# Patient Record
Sex: Male | Born: 1960 | Race: White | Hispanic: No | Marital: Married
Health system: Southern US, Community
[De-identification: ages and names within clinical notes are randomized; demographics above are authoritative.]

---

## 2004-06-21 ENCOUNTER — Ambulatory Visit (HOSPITAL_COMMUNITY): Admission: RE | Admit: 2004-06-21 | Discharge: 2004-06-21 | Payer: Self-pay

## 2004-08-16 ENCOUNTER — Ambulatory Visit (HOSPITAL_COMMUNITY): Admission: RE | Admit: 2004-08-16 | Discharge: 2004-08-16 | Payer: Self-pay | Admitting: Neurology

## 2004-08-18 ENCOUNTER — Emergency Department (HOSPITAL_COMMUNITY): Admission: EM | Admit: 2004-08-18 | Discharge: 2004-08-18 | Payer: Self-pay | Admitting: Emergency Medicine

## 2004-08-28 ENCOUNTER — Inpatient Hospital Stay (HOSPITAL_COMMUNITY): Admission: EM | Admit: 2004-08-28 | Discharge: 2004-08-30 | Payer: Self-pay | Admitting: Emergency Medicine

## 2004-08-30 ENCOUNTER — Ambulatory Visit: Payer: Self-pay | Admitting: *Deleted

## 2006-01-25 IMAGING — CT CT HEAD W/O CM
1 of 3 series · 16 of 30 positions shown, 20 images · non-contrast
Comparison: 06/21/04.

CLINICAL DATA: Two day history of dizziness.  
 HEAD CT WITHOUT CONTRAST ? 08/28/04:
TECHNIQUE: 5 mm collimated images were obtained from the base of the skull through the vertex according to standard protocol without contrast.

[Series 2189: — · axial · 0.42mm/px · z∈[-577,-457]mm · 16 of 28 slices shown, 20 images]
[im 2/28  brain]
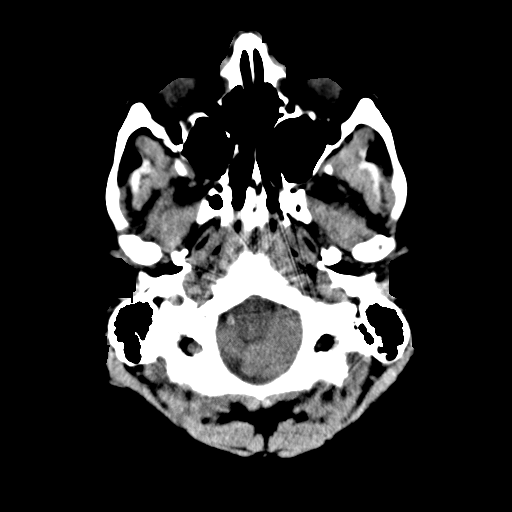
[im 2/28  bone]
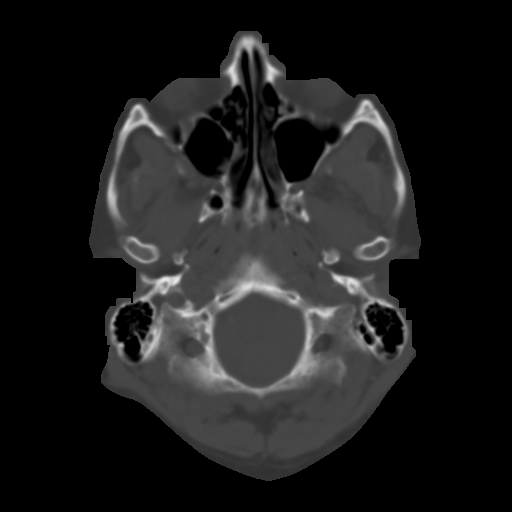
[im 4/28  brain]
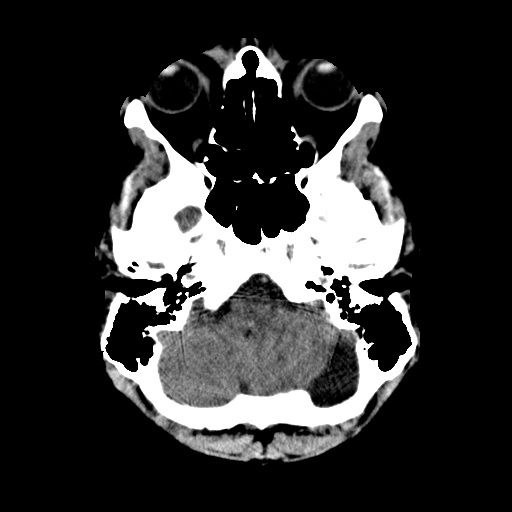
[im 5/28  brain]
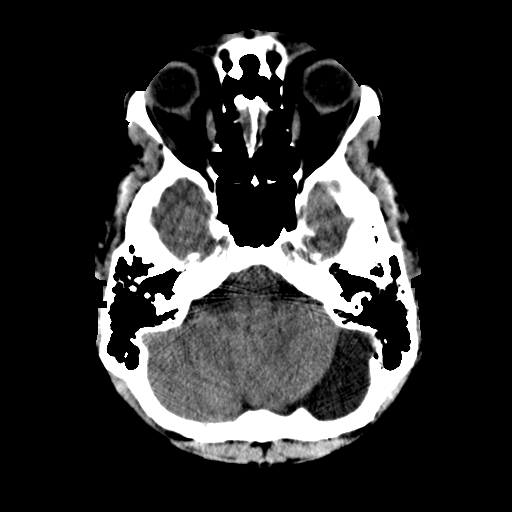
[im 7/28  brain]
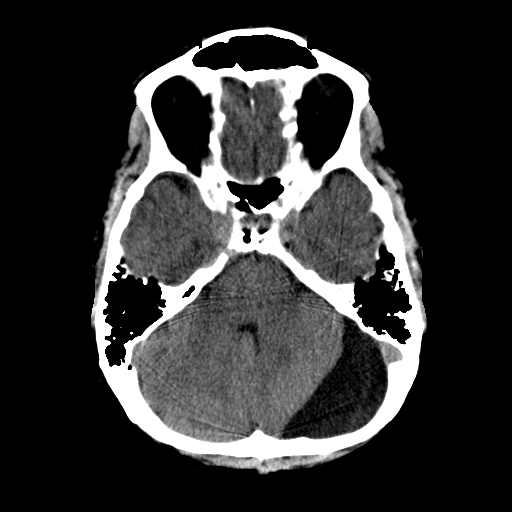
[im 8/28  brain]
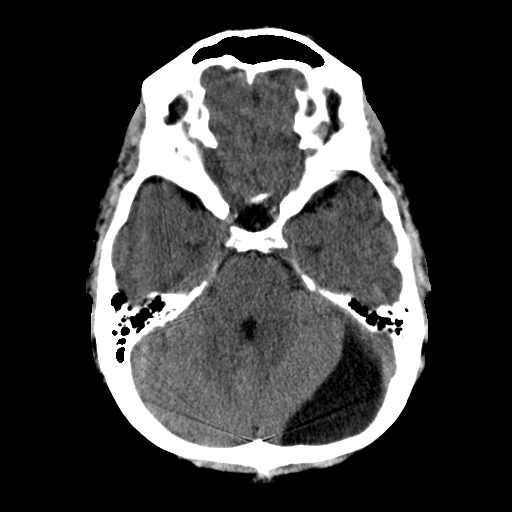
[im 8/28  bone]
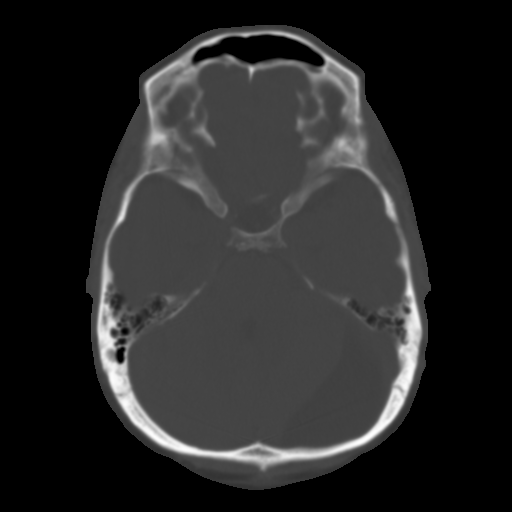
[im 10/28  brain]
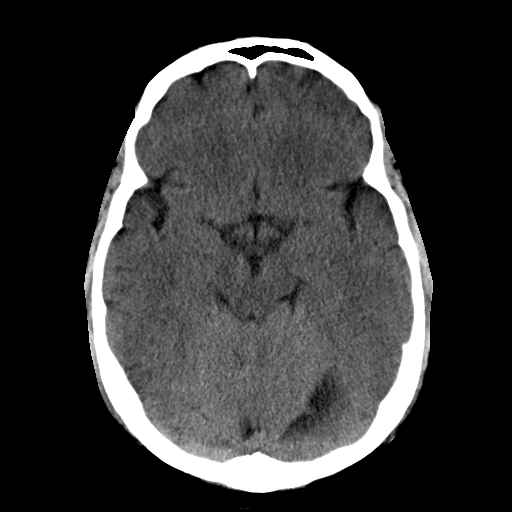
[im 11/28  brain]
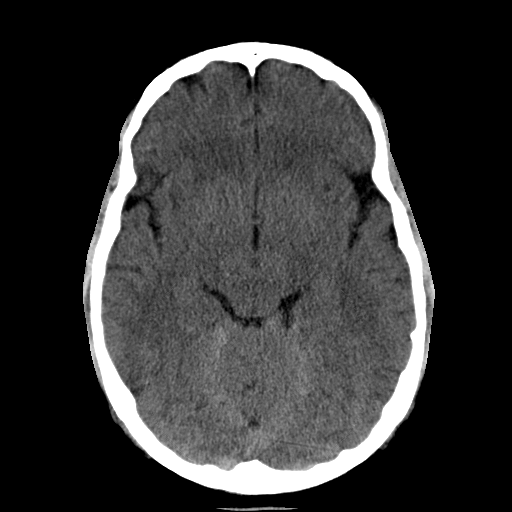
[im 13/28  brain]
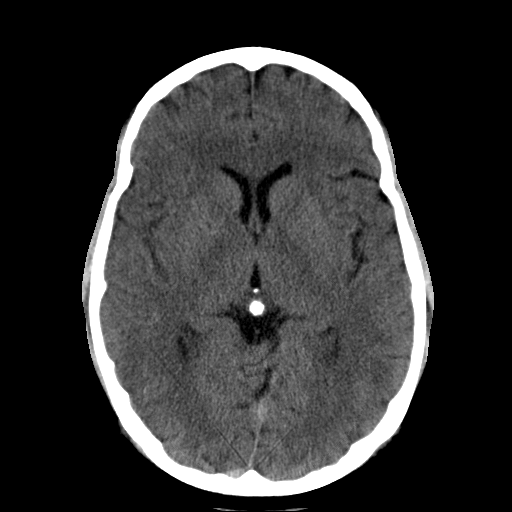
[im 15/28  brain]
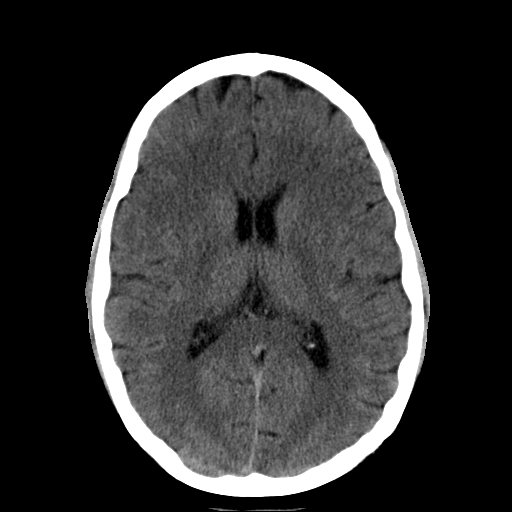
[im 15/28  bone]
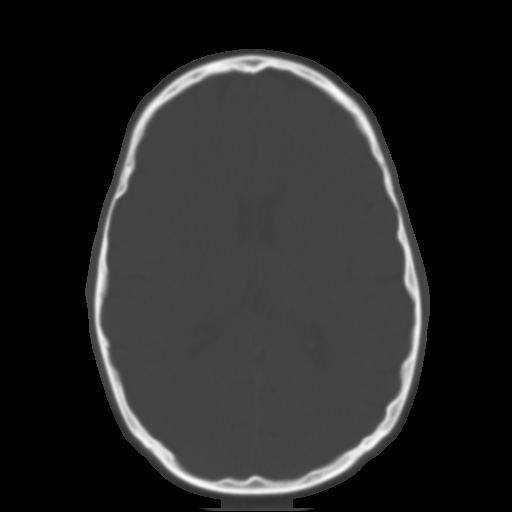
[im 17/28  brain]
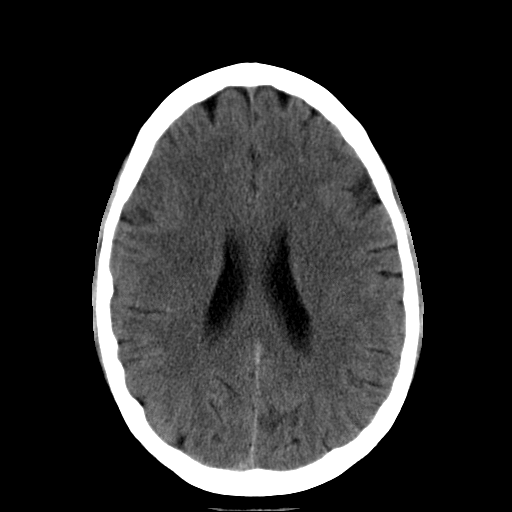
[im 18/28  brain]
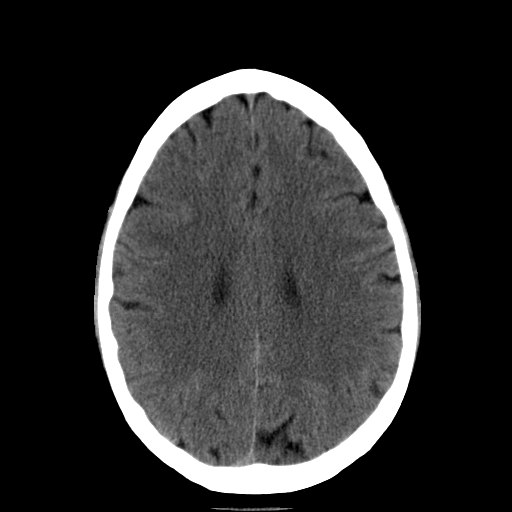
[im 20/28  brain]
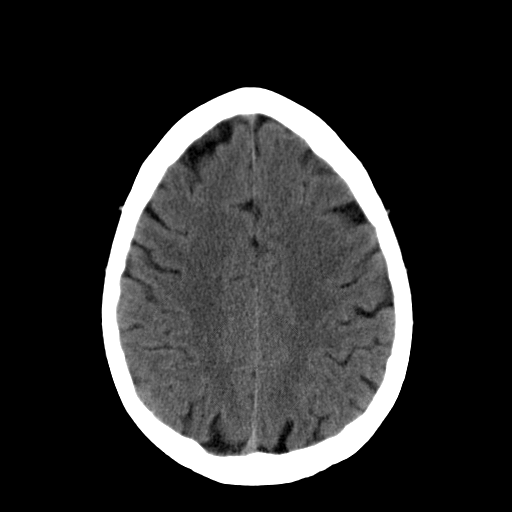
[im 21/28  brain]
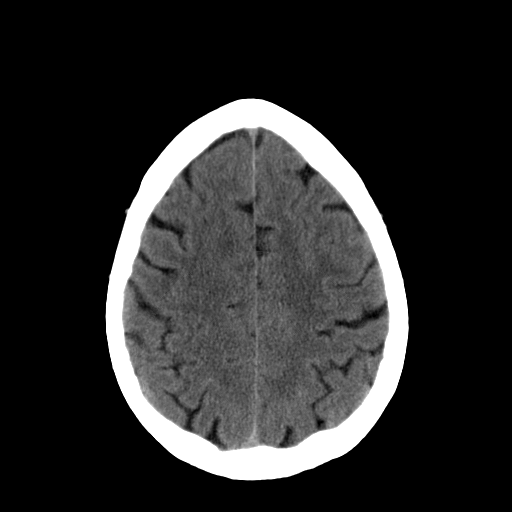
[im 21/28  bone]
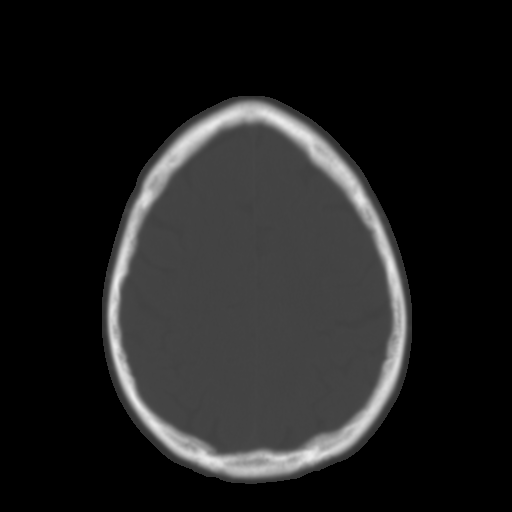
[im 23/28  brain]
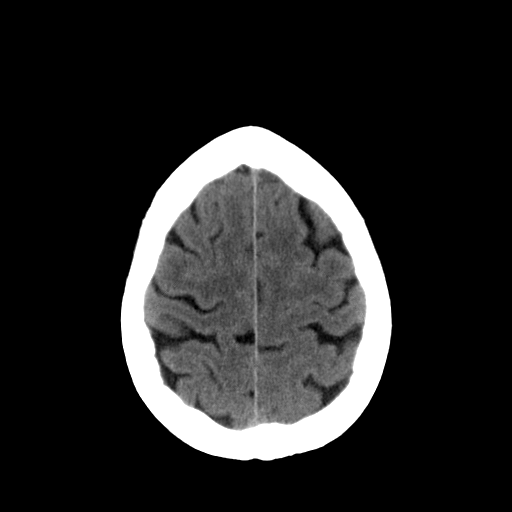
[im 24/28  brain]
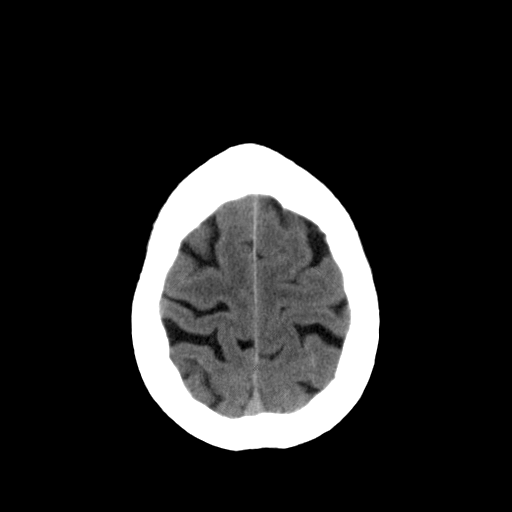
[im 26/28  brain]
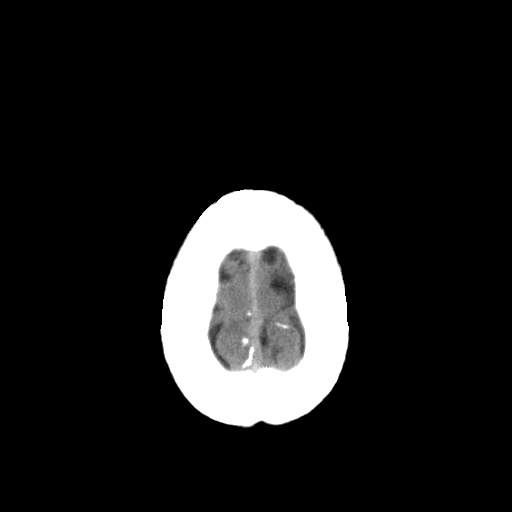

[16 of 30 positions shown; findings below may reference images not displayed]

FINDINGS: There is no acute hemorrhage or infarction or other significant abnormality.  Again noted is an arachnoid cyst in the left side of the posterior fossa.  There is a slight mass effect upon the fourth ventricle but this is unchanged since the prior examination.  This finding has been previously evaluated on MRI scan.
 The visualized paranasal sinuses and mastoid air cells are normal.  The internal auditory canals are bilaterally symmetrical and normal.
IMPRESSION: No acute abnormality.
 Stable posterior fossa arachnoid cyst.

## 2006-01-27 IMAGING — US US CAROTID DUPLEX BILAT
1 series · 14 of 24 positions shown · non-contrast
Comparison: none

HISTORY: Dizziness, presyncope, hypertension, question stroke

[Series 1: unknown · 0.09mm/px · 14 of 58 slices shown]
[im 1/58]
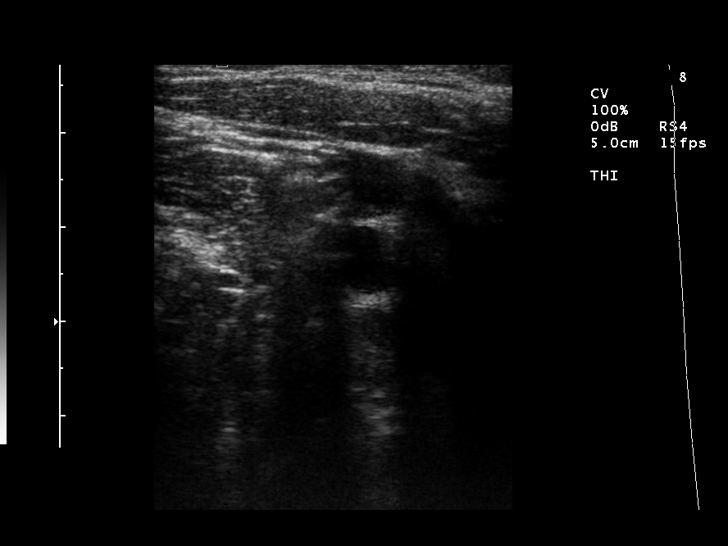
[im 5/58]
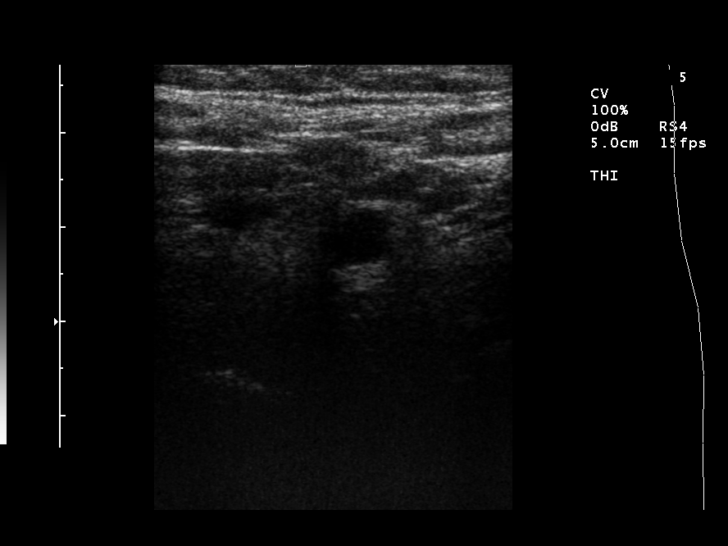
[im 10/58]
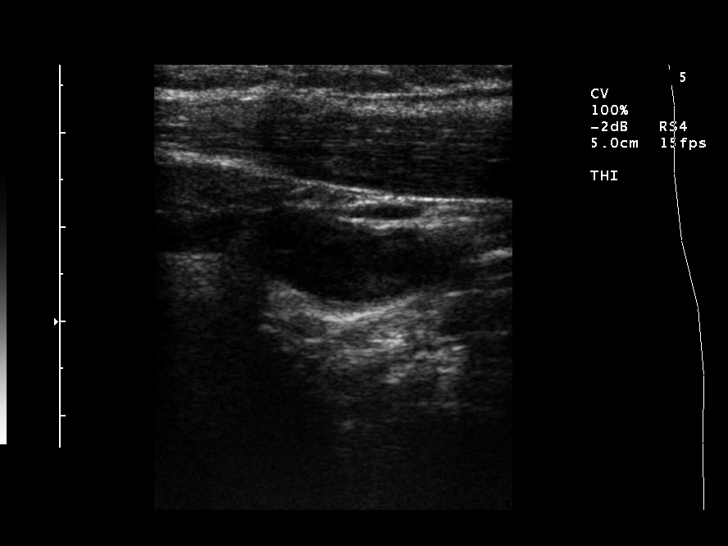
[im 15/58]
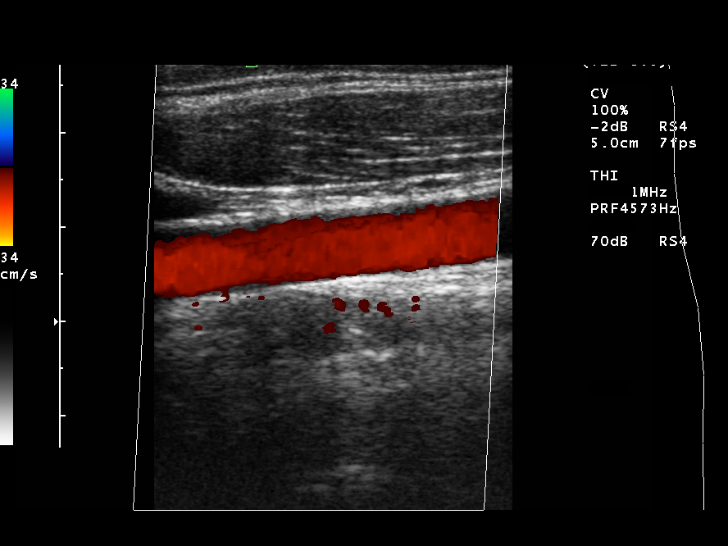
[im 18/58]
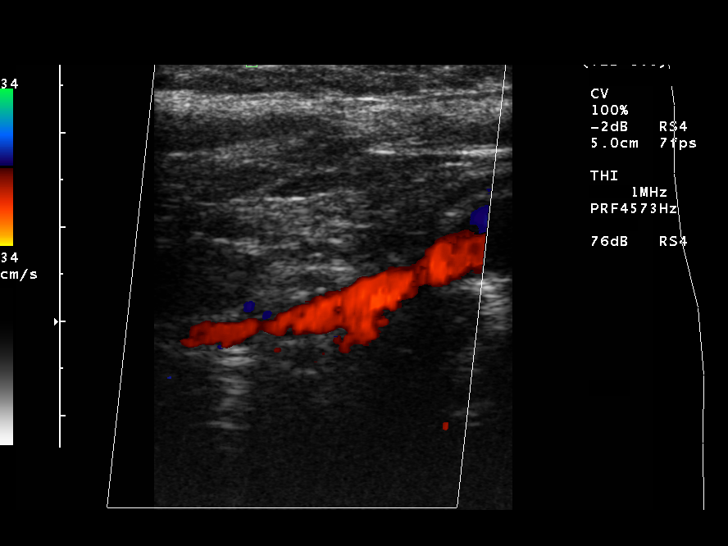
[im 23/58]
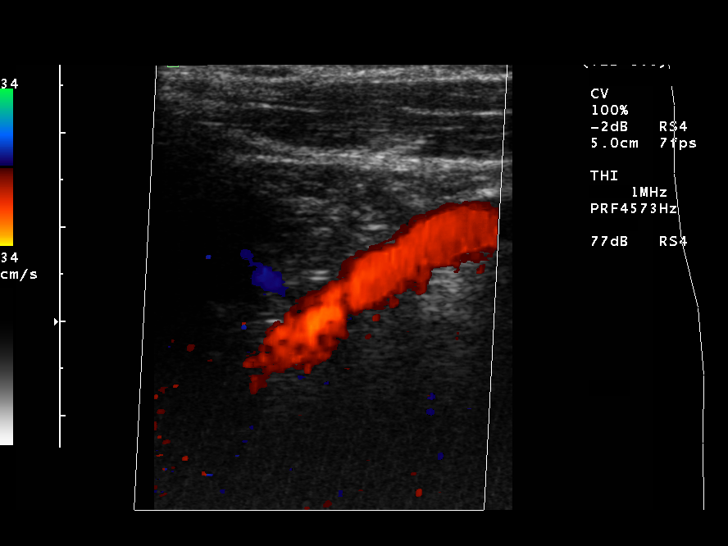
[im 28/58]
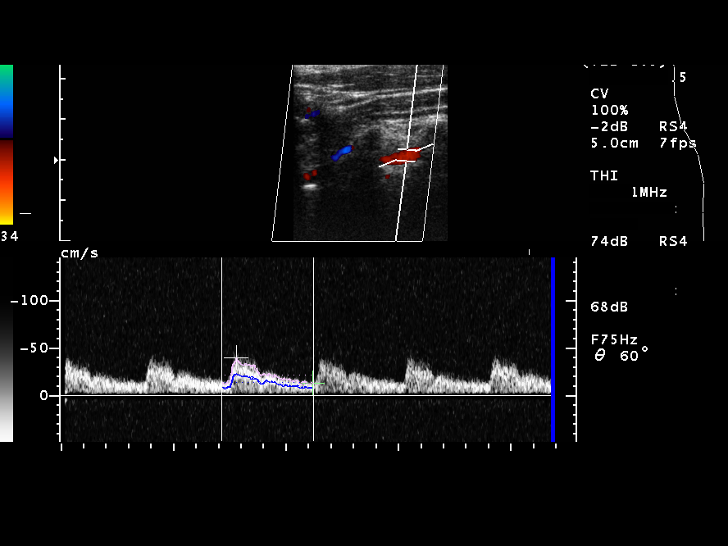
[im 30/58]
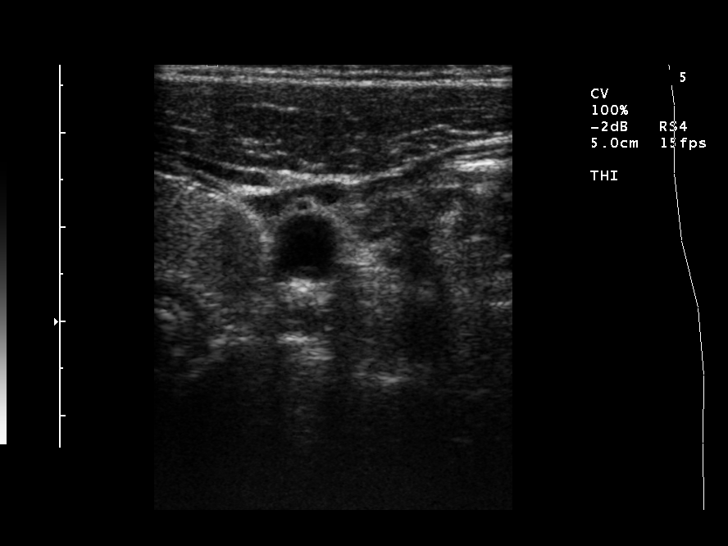
[im 35/58]
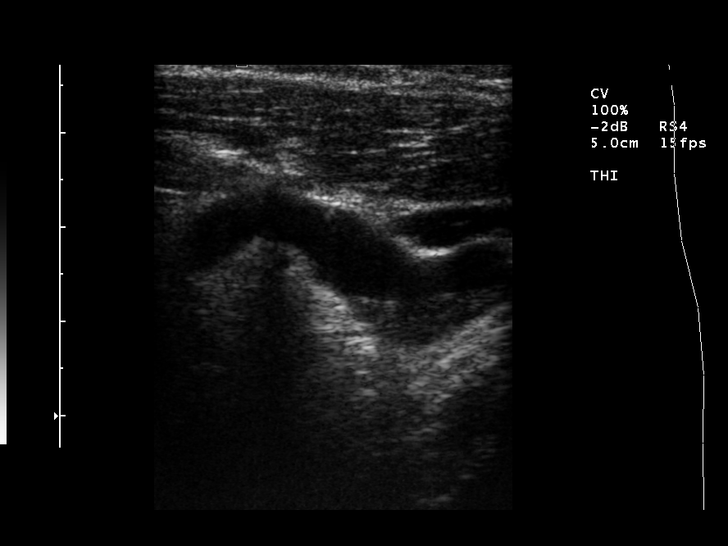
[im 40/58]
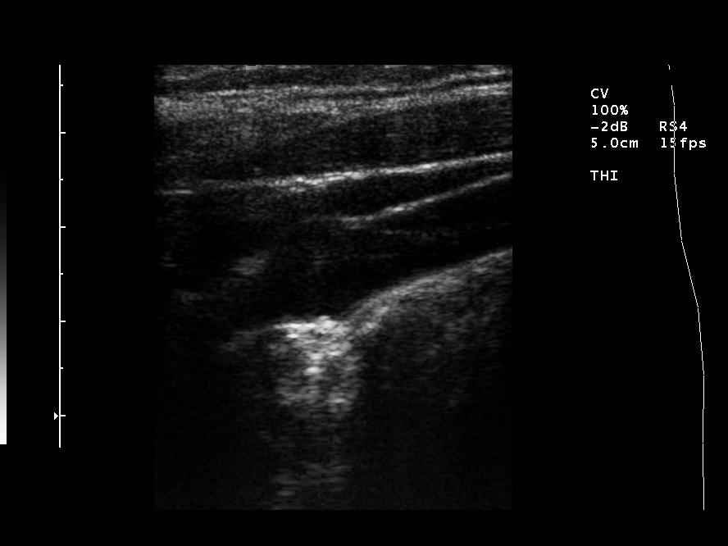
[im 45/58]
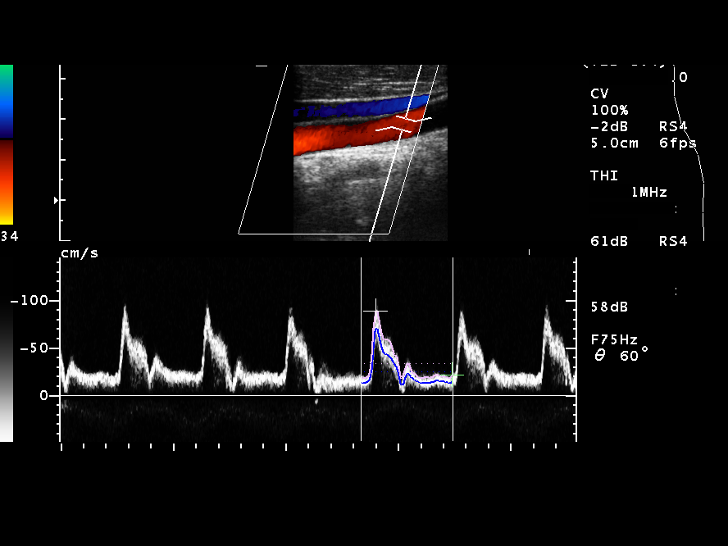
[im 48/58]
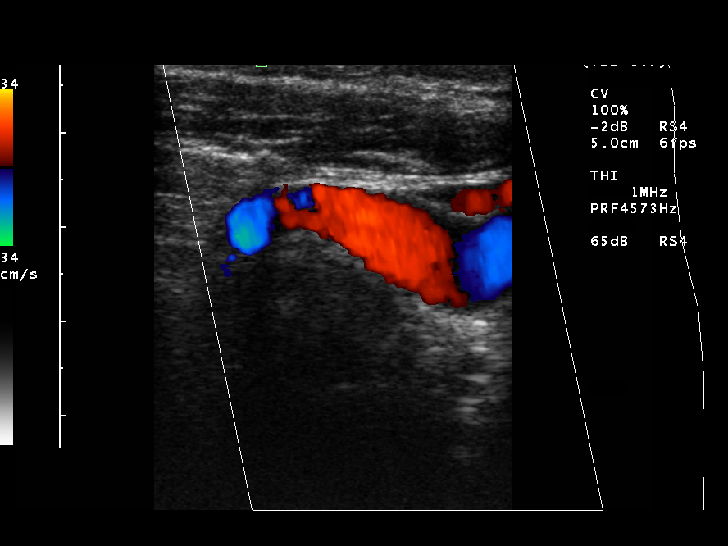
[im 53/58]
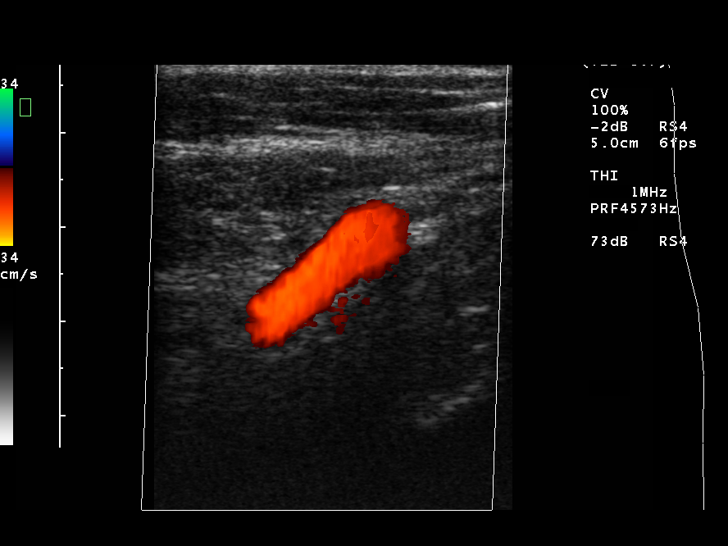
[im 58/58]
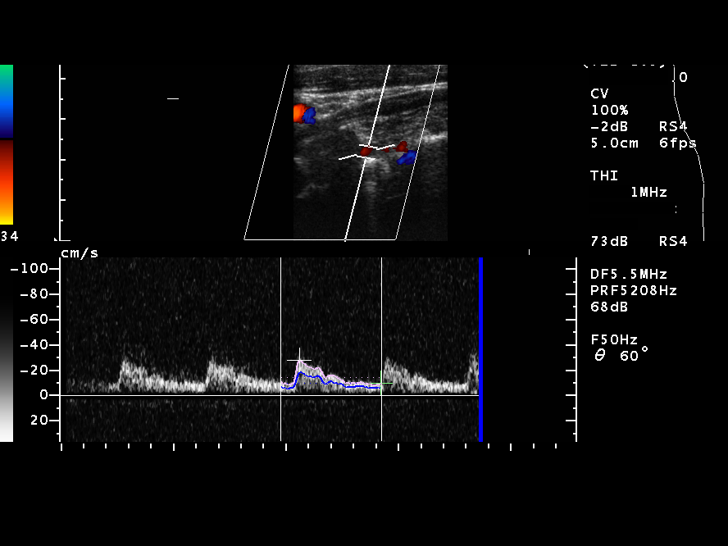

[14 of 24 positions shown; findings below may reference images not displayed]

ULTRASOUND CAROTID DUPLEX BILATERAL:

No significant plaque formation on grayscale imaging.
Mildly tortuous carotid systems.
No high velocity jets seen on color Doppler imaging.
Minimal spectral broadening on wave form analysis, likely related to tortuosity.
Following peak systolic velocities obtained, in cm per second:

Right CCA 77
Right ICA 81
Right ECA 93
Right ICA/CCA ratio

Left CCA 89
Left ICA 75
Left ECA 94
Left ICA/CCA ratio

Antegrade flow present bilateral vertebral arteries.
IMPRESSION: Minimally tortuous carotid systems bilaterally.
No evidence of plaque formation or stenosis.

## 2007-06-06 ENCOUNTER — Ambulatory Visit: Payer: Self-pay | Admitting: Internal Medicine

## 2007-06-06 ENCOUNTER — Ambulatory Visit (HOSPITAL_COMMUNITY): Admission: RE | Admit: 2007-06-06 | Discharge: 2007-06-06 | Payer: Self-pay | Admitting: Internal Medicine

## 2007-08-01 ENCOUNTER — Ambulatory Visit: Payer: Self-pay | Admitting: Internal Medicine

## 2010-07-27 NOTE — Op Note (Signed)
NAMELACOREY, BRUSCA                  ACCOUNT NO.:  [id number]   MEDICAL RECORD NO.:  [id number]          PATIENT TYPE:  AMB   LOCATION:  DAY                           FACILITY:  [facility]   PHYSICIAN:  R. Roetta Sessions, M.D. DATE OF BIRTH:  [birth year]   DATE OF PROCEDURE:  06/06/2007  DATE OF DISCHARGE:                               OPERATIVE REPORT   PROCEDURE:  Esophagogastroduodenoscopy with pyloric channel dilation.   INDICATIONS FOR PROCEDURE:  A 50 year old gentleman with refractory  reflux symptoms he describes as  heartburn and regurgitation, suboptimal  response to Prevacid previously.  He has briefly been on Zegerid over  the past 3 weeks prescribed Dr. Juanetta Gosling, with some improvement in his  symptoms.  His nocturnal symptoms are particularly bothersome.  He does  not have any odynophagia or dysphagia.  EGD is now being done.  Potential risks, benefits and alternatives, limitations have been  reviewed and questions answered.  He is agreeable.  Please see the  documentation in the medical record.   PROCEDURE NOTE:  O2 saturation, blood pressure, pulse and respirations  were monitored throughout the entire procedure.  Conscious sedation:  Versed 7 mg IV, Demerol 150 mg IV in divided doses, Tessalon Perles for  oropharyngeal anesthesia.  Instrument:  Pentax video chip system.   FINDINGS:  Examination of the tubular esophagus revealed normal mucosa.  EG junction patent and easily traversed.   Stomach:  The gastric cavity was empty and appeared somewhat cavernous.  It insufflated very well with air.  A thorough examination of the  gastric mucosa including retroflexed view of the proximal stomach and  esophagogastric junction demonstrated only a small hiatal hernia and an  abnormal pyloric channel.  The pyloric channel appeared fibrotic and  stenotic.  It appeared to be scarred down.  In fact, the aperture  through the pyloric channel was critically small and I was unable to  admit  the scope, even with moderate pressure applied.  Please see  multiple photos.  There was no ulcer or infiltrating process.  I railed  a TTS dilating balloon (10-12 mm) through the scope and placed it across  the stenotic lumen and sequentially dilated up to 12 mm maximally for  one minute, took the balloon down and again attempted to get through the  pyloric channel.  Although the balloon did produce some dilation, the  aperture remained critically small and it would not admit the scope.  Subsequently I railed a 12-15-mm graduated balloon through the scope and  across pylorus channel and sequentially dilated up to a maximal 15 mm,  8.5 atmospheres, for 1 minute.  I took the balloon down.  There was  minimal bleeding with this maneuver.  However, this was effective in  opening up the lumen to a point were I could easily pass the scope.  There was no apparent complication related to this maneuver.  Aside from  the stomach, pyloric channel, the bulb and second portion appeared  entirely normal.  The patient tolerated the somewhat prolonged EGD very  well, was reacted in endoscopy.  IMPRESSION:  1. Normal esophagus.  2. Small hiatal hernia.  3. A somewhat cavernous but otherwise normal-appearing stomach.  4. Abnormal stenotic, fibrotic, scarred pyloric channel producing      significant gastric outlet obstruction, status post sequential      balloon dilation as described above.  5. Otherwise D1 and D2 appeared normal.   I suspect the patient is experiencing reflux and nocturnal  regurgitation, in large part secondary to delayed gastric emptying.  Today's findings could be secondary to nonsteroidal use over the years.  Helicobacter pylori needs to be ruled out.   I doubt this is a congenital lesion.   RECOMMENDATIONS:  1. Continue Zegerid 40 mg orally daily.  He needs to go by my office      for some free samples.  2. We are going to check H. pylori serologies.  3. Should avoid  nonsteroidal agents.  4. Follow-up appointment [place] in 6-8 weeks.      Jonathon Bellows, M.D.  Electronically Signed     RMR/MEDQ  D:  06/06/2007  T:  06/06/2007  Job:  [id number]   cc:   Ramon Dredge L. Juanetta Gosling, M.D.  Fax: [phone]

## 2010-07-27 NOTE — Assessment & Plan Note (Signed)
Ronnie, Hicks                   CHART#:  [id number]   DATE:                                   DOB:  [birth year]   CHIEF COMPLAINT:  Followup of gastroesophageal reflux disease.   HISTORY OF PRESENT ILLNESS:  An EGD demonstrated recently a small hiatal  hernia and critical pyloric stenosis, requiring TTS balloon dilation to  get through with the scope.  H. pylori serologies came back negative.  He gives a history of having peptic ulcer disease when he was [age].  He  is not using any NSAIDs.  He took Zegerid for a short period of time.  This was of dramatic benefit in combating his reflux symptoms, but he  developed wheezing and snoring at night with the onset of the use of  this medication.  He stopped the Zegerid.  Those symptoms subsided.  He  resumed it once again and they recurred.  Aside from side effects, he  had a nice response to Zegerid.   CURRENT MEDICATIONS:  See the updated list.   PHYSICAL EXAMINATION:  GENERAL:  Today he looks well.  VITAL SIGNS:  Weight 208 pounds, height 5 feet 8-1/2 inches, temperature  98.3 degrees, blood pressure 122/84, pulse 72.  DETAILED EXAM:  Deferred.   ASSESSMENT:  1. Gastroesophageal reflux disease.  2. Small hiatal hernia.  3. Pyloric stenosis, status post TTS balloon dilation.   RECOMMENDATIONS:  Will start him on some Aciphex 20 mg orally daily,  samples #14, one 30 minutes before breakfast daily.  He will let us know  in two weeks how that is working for him, then we will go from there, as  far as chronic acid suppression therapy goes.  He is a good 25 pounds  over his ideal body weight for height and body frame goes.  I suggested  if he could lose  20 to 25 pounds, the need for acid suppression therapy may wane  considerably.  Will see how he does with the Aciphex and make further  recommendations in the very near future.       Jonathon Bellows, M.D.  Electronically Signed     RMR/MEDQ  D:  08/01/2007  T:  08/01/2007   Job:  [id number]   cc:   Ramon Dredge L. Juanetta Gosling, M.D.

## 2010-07-30 NOTE — Consult Note (Signed)
Ronnie Hicks, Ronnie Hicks NO.:  [id number]   MEDICAL RECORD NO.:  [id number]          PATIENT TYPE:  INP   LOCATION:  A225                          FACILITY:  [facility]   PHYSICIAN:  Vida Roller, M.D.   DATE OF BIRTH:  1960-04-03   DATE OF CONSULTATION:  DATE OF DISCHARGE:  08/30/2004                                   CONSULTATION   PRIMARY CARE PHYSICIAN:  Ramon Dredge L. Juanetta Gosling, M.D.   HISTORY OF PRESENT ILLNESS:  Mr. Nawaz is a 50 year old man who has no real  significant past medical history other than hypertension.  Interestingly has  an arachnoid cyst in the posterior fossa of his brain, which was found  recently after a headache workup.  I guess he has had some problems with  headaches over the course of the last 6-8 months, including some question of  glaucoma.  He had this CT scan done, which showed a question of a posterior  fossa cyst.  MRI revealed it to be an arachnoid cyst.  He is currently  awaiting neurologic evaluation for this.  He states that he was pulling  weeds a couple of days ago and noticed a sensation where he really kind of  got dizzy, lightheaded, generally weak, had trouble moving his legs.  He had  some tingling on the left side of his face, down his left arm.  No chest  discomfort and shortness of breath.  The weakness was relatively  progressive, and he had to sit down.  It lasted for about 25 minutes.  He  was able to speak, but he really felt pretty bad for about an hour  afterwards.  This occurred on a second occasion, and he came into the  hospital and was evaluated.  He has had about 48 hours of telemetry now  without any significant arrhythmia.   PAST MEDICAL HISTORY:  Hypertension. Gastroesophageal reflux disease.   He has never had surgery.  He had a treadmill about a year ago for reasons  that are unclear to me, and he reports are okay, but we do not have any  history of that.   He lives in [place] with his wife.  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  He  is married and has a son who is 3-1/2.  He does not smoke, drink, or use  illicit drugs.  He pushes a lawnmower on a regular basis.   FAMILY HISTORY:  His father died of complications of a stroke.  His mother  is alive at age 4 and is healthy.   He is allergic to BENADRYL and NOVOCAIN, which both of those give him  palpitations.  PHENERGAN and REGLAN.   He is currently on Benazepril, unknown dose, and aspirin 325 mg once daily,  Ativan 0.5 mg q.4h. and Protonix 80 mg once daily.  He is on 1/2 normal  saline.   REVIEW OF SYSTEMS:  He denies any fevers or chills.  No sweats or weight  loss.  He does have headaches but no sinus discharge or nasal bleeding.  He  thinks he might have a little bit of hearing difficulty.  He does have some  tinnitus but no rash or lesions.  He does have no shortness of breath or  dyspnea on exertion.  No PND or orthopnea.  No lower extremity edema.  He  has no urinary frequency or urgency.  He does have the weakness, as  previously described, but no arthralgias.  No nausea, vomiting, diarrhea, or  bright red blood per rectum, although he tells me he has increase in the  incidence of hiccups, which seem to occur for no apparent reason.  No  polyuria or polydipsia, and the remainder of his review of systems is  negative.   PHYSICAL EXAMINATION:  VITAL SIGNS:  Afebrile.  Pulse 66, respirations 18,  blood pressure 129/86.  He is tilt-negative by orthostatic pulses and blood  pressures.  GENERAL:  He is a well-developed and well-nourished, quite pleasant white  male in no apparent distress.  He is alert and oriented x 4.  HEENT:  Unremarkable.  I did not do a funduscopic exam.  LUNGS:  Clear to auscultation bilaterally.  He has no carotid bruits or  jugular venous distention.  He has normal respiratory movement.  HEART:  Regular.  He has a 1/6 systolic murmur heard best in the right upper  sternal border and has normal first and  second heart sounds.  No lifts or  thrills.  Point of maximal impulse is nondisplaced.  ABDOMEN:  Soft, nontender with normoactive bowel sounds.  EXTREMITIES:  Lower extremities are without clubbing, cyanosis or edema.  NEUROLOGIC:  Grossly nonfocal.   Head CT shows a stable posterior fossa arachnoid cyst.   Chest x-ray was not performed.   Electrocardiogram shows sinus rhythm at a rate of 59 with normal intervals  and normal axes.  No ischemic ST/T wave changes.  No Q waves are seen.   White blood cell count is 5.8, H&H 16 and 46.  Platelet count 265.  Sodium  135, potassium 3.5, chloride 103, bicarb 24, BUN 13, creatinine 1.  His  blood sugar is 130, nonfasting.  Cardiac enzymes x3 are negative.   Echocardiogram shows a normal left ventricular systolic function without  obvious valvular heart disease.  It is a poor study.   Telemetry is completely without any significant ectopy.   I kind of doubt this is cardiac in etiology, sort of all pointing towards a  neurologic finding.  I think he is probably worthwhile in having this  pursued on a relatively aggressive basis.  I think he probably needs to see  a neurologist for further consideration, and I have discussed this with Dr.  Juanetta Gosling.       JH/MEDQ  D:  08/30/2004  T:  08/30/2004  Job:  [id number]

## 2010-07-30 NOTE — H&P (Signed)
NAMESHALIN, VONBARGEN                  ACCOUNT NO.:  [id number]   MEDICAL RECORD NO.:  [id number]          PATIENT TYPE:  INP   LOCATION:  A225                          FACILITY:  [facility]   PHYSICIAN:  Angus G. Renard Matter, MD   DATE OF BIRTH:  1960/12/25   DATE OF ADMISSION:  08/28/2004  DATE OF DISCHARGE:  LH                                HISTORY & PHYSICAL   HISTORY:  [age]-year-old patient of Dr. Juanetta Gosling was seen in the  emergency room by ER physician with a history of weakness and near syncope.  Apparently patient had a near-syncopal episode while pulling weeds last  evening and the same this morning while sitting.  He felt as though his arms  were numb, his legs were _jumpy_________ .  This patient was evaluated in  the ED.  A CT of the head was essentially negative for acute change.  Patient does have a history of arachnoid cyst.  Dr. Colon Branch, the ED physician  discussed the situation with Dr. Newell Coral of [facility] and  neurologist at [facility], Dr. Pearlean Brownie -  both of which felt that these  symptoms have nothing to do with the arachnoid cyst but felt further syncope  workup was in order, i.e., carotid Doppler ultrasound, echocardiogram and  possible stress test for myocardial pathology and also suggested MRA later  possibly as an outpatient to evaluate the blood vessels.  They felt that  there is no reason to transfer there.  Patient was given Ativan for anxiety  with some relief.   SOCIAL HISTORY:  Patient does not smoke or drink alcohol.   FAMILY HISTORY:  See previous record.   PAST HISTORY:  Hypertension.   PAST SURGERY:  Patient has had no surgery.   ALLERGIES:  BENADRYL, NOVOCAIN, PHENERGAN and REGLAN.   PHYSICAL EXAMINATION:  VITAL SIGNS:  Blood pressure 114/73, respirations 22,  heart rate 80, temperature 98.  HEENT:  Eyes:  PERRLA.  TM:  Negative.  Oropharynx:  Benign.  NECK:  Supple; no JVD or thyroid abnormalities.  HEART:  Regular rhythm; no  murmurs.  LUNGS:  Clear to P&A.  ABDOMEN:  No palpable organs or masses; no organomegaly.  SKIN:  Warm and dry.  EXTREMITIES:  Free of edema.  NEUROLOGICAL:  Cranial nerves intact; no focal deficit.   DIAGNOSES:  1.  Near syncope of undetermined etiology.  2.  Arachnoid cyst.  3.  History of hypertension.  4.  History gastroesophageal reflux disease.   LABORATORY DATA:  CBC:  WBC 5800, hemoglobin 16.2, hematocrit 46.4.  Chemistries:  Sodium 135, potassium 3.5, chloride 103, CO2 24, glucose 136,  BUN 13, creatinine 1, calcium 8.9.  Myoglobin 67.8.  CPK-MB 1.3, troponin 1.       AGM/MEDQ  D:  08/28/2004  T:  08/28/2004  Job:  [id number]

## 2010-07-30 NOTE — Procedures (Signed)
[name], Ronnie Hicks NO.:  [id number]   MEDICAL RECORD NO.:  [id number]          PATIENT TYPE:  INP   LOCATION:  A225                          FACILITY:  [facility]   PHYSICIAN:  Vida Roller, M.D.   DATE OF BIRTH:  12/09/1960   DATE OF PROCEDURE:  08/30/2004  DATE OF DISCHARGE:                                  ECHOCARDIOGRAM   TAPE NUMBER:  [id number].   TAPE COUNT:  [id number] - [id number].   REASON FOR PROCEDURE:  This is a 50 year old man with syncope.   TECHNICAL QUALITY:  Difficult due to poor acoustic windows.   M-MODE TRACINGS:  The aorta is 34 mm.   The left atrium is 38 mm.   The septum is 12 mm.   The posterior wall is 12 mm.   The left ventricular diastolic dimension is 40 mm.   Left ventricular systolic dimension is 28 mm.   2-D AND DOPPLER IMAGING:  The left ventricle is normal size with normal  systolic function. There did not appear to be significant wall motion  abnormality. I think there is probably mild concentric left ventricular  hypertrophy. The right ventricle is not well seen.   The right atrium is not well seen. The left atrium appears to be normal  size.   The aortic valve is not well seen but does not appear to have significant  stenosis or regurgitation by Doppler profile.   The mitral valve also not terribly well seen but appears to have no stenosis  or regurgitation on Doppler profile.   There is no pericardiac effusion.       JH/MEDQ  D:  08/30/2004  T:  08/30/2004  Job:  [id number]

## 2010-07-30 NOTE — Group Therapy Note (Signed)
NAMEGLENDON, [name] NO.:  [id number]   MEDICAL RECORD NO.:  [id number]          PATIENT TYPE:  INP   LOCATION:  A225                          FACILITY:  [facility]   PHYSICIAN:  Ronnie Hicks, M.D.DATE OF BIRTH:  [birth year]   DATE OF PROCEDURE:  DATE OF DISCHARGE:                                   PROGRESS NOTE   PROBLEM:  Episodes of dizziness and off balance   OBJECTIVE:  Ronnie Hicks says he is better.  He has no new complaints.  His  temperature is 97.6, pulse 66, respirations 18, blood pressure 129/86, O2  sat 95%.  He says he feels a little fuzzy, but otherwise okay.  As mentioned  yesterday, I discussed his situation with one of the neurologist at [facility].  He would like to seek an opinion there.  They agreed that we should go ahead  with an echocardiogram, carotid study, MRA, and perhaps Cardiology  consultation to be sure that we are not missing something.  I think all of  those are reasonable, and hopefully after these are initiated, he will be  able to go home. He says he feels well and wants to go home.  We are going  to set him up to see the neurologist early in the week.       ELH/MEDQ  D:  08/30/2004  T:  08/30/2004  Job:  [id number]

## 2010-07-30 NOTE — Discharge Summary (Signed)
[name], Ronnie Hicks NO.:  [id number]   MEDICAL RECORD NO.:  [id number]          PATIENT TYPE:  INP   LOCATION:  A225                          FACILITY:  [facility]   PHYSICIAN:  Edward L. Juanetta Gosling, M.D.DATE OF BIRTH:  11-21-1960   DATE OF ADMISSION:  08/28/2004  DATE OF DISCHARGE:  06/19/2006LH                                 DISCHARGE SUMMARY   FINAL DISCHARGE DIAGNOSES:  1.  Near-syncopal episode, etiology unknown.  2.  Hypertension.  3.  Chronic headache.  4.  Subarachnoid cyst.   HISTORY:  Ronnie Hicks is a 50 year old who has had episodes of near syncope.  He  has had 2-3 of these episodes.  One occurred while he was working in his  yard; however, the other one occurred with no specific precipitating reason.  He has a change when he feels like he develops a sensation of being dizzy  and weak.  He develops a sensation in the left side of his face and then a  sensation in both arms.  He has a previous history of an arachnoid cyst.  He  has been having headaches which are actually on the right side of his head.  The arachnoid cyst is on the left side.   PHYSICAL EXAMINATION:  VITAL SIGNS:  Blood pressure 114/73, respirations 22,  heart rate 80, temp 98.  NEUROLOGIC:  He is intact.  He did not have any changes of postural  hypotension.   White count 5800, hemoglobin 16.2.  Electrolytes were normal.  Myoglobin 67,  CK-MB 1.3.   HOSPITAL COURSE:  He had multiple evaluations, including carotid studies,  which were normal.  Echocardiogram, which was normal.  MRA, which was  normal.  Cardiac enzymes serially and EKG's serially, which were normal.   He had a consultation with Dr. Dorethea Clan of [facility], who felt that  he did not have a cardiac cause of his syncope.   Ronnie Hicks already has an appointment made to see Dr. Rudene Christians regarding  his headaches.  I have encouraged him to keep that palpitations.  In  addition to that, he has requested that he see one of  the neurologists at  [facility] for further evaluation regarding the  subarachnoid cyst, etc.  He has an appointment with them, I believe, on June  29th.       ELH/MEDQ  D:  08/30/2004  T:  08/30/2004  Job:  [id number]   cc:   Clabe Seal. Meryl Crutch, M.D.  [address]  [place]  [place] [address]  Fax: [phone]   Dept. of Neurology [facility]

## 2010-07-30 NOTE — Group Therapy Note (Signed)
Ronnie Hicks, CAPPELLA NO.:  [id number]   MEDICAL RECORD NO.:  [id number]          PATIENT TYPE:  INP   LOCATION:  A225                          FACILITY:  [facility]   PHYSICIAN:  Edward L. Juanetta Gosling, M.D.DATE OF BIRTH:  1960/03/30   DATE OF PROCEDURE:  08/29/2004  DATE OF DISCHARGE:                                   PROGRESS NOTE   SUBJECTIVE:  [name] was admitted yesterday with a near syncopal episode and  a sensation of being dizzy. His history is that he has had several episodes  of being weak, feeling like he is unsteady and dizzy, a sensation of a place  on his left cheek that does not feel right, some heaviness of his arms, and  some problem in his left arm with some numbness. These have lasted about 15  minutes, sometimes positional, sometimes not. He also has severe right-sided  headaches. He has not had any more symptoms since he was here.   OBJECTIVE:  VITAL SIGNS:  His exam showed his temperature was 97.6, pulse  66, respirations 16, blood pressure 114/73, O2 saturations 96% on 2 liters.   Thus far, his lab work is normal. EKGs have been normal.   ASSESSMENT:  I think this is likely something noncardiac and probably in the  neurology realm. He does have some tinnitus also, however, so it is entirely  possible that it is even an ENT problem. Mr. Pomplun, his wife and I have  discussed this at length. I have called Dr. Dan Humphreys who is a neurologist at  [facility] Dr. Dan Humphreys does not feel that Mr. Brackney needs  to be transferred. He suggests instead that we finish cardiac workup as  already ordered, get the MRA/MRI and then he will be seen early in the week  as an outpatient, assuming that we do not come up with some other cause.       ELH/MEDQ  D:  08/29/2004  T:  08/30/2004  Job:  [id number]

## 2015-06-19 ENCOUNTER — Telehealth: Payer: Self-pay | Admitting: Internal Medicine

## 2015-06-19 NOTE — Telephone Encounter (Signed)
[place] [facility] and wanted to know who RMR would recommend in the [place] area for GI. [phone]

## 2015-06-19 NOTE — Telephone Encounter (Signed)
Routing to RMR. 

## 2015-06-19 NOTE — Telephone Encounter (Signed)
Dr. Kellie MoorJohn Holt

## 2015-06-22 NOTE — Telephone Encounter (Signed)
Called pt- NA- LMOM with recommendations and phone number for Dr.Holt. (from website)

## 2015-06-22 NOTE — Telephone Encounter (Signed)
Pt called office back and is aware
